# Patient Record
Sex: Female | Born: 1975 | Race: Black or African American | Hispanic: No
Health system: Southern US, Community
[De-identification: ages and names within clinical notes are randomized; demographics above are authoritative.]

---

## 2009-07-03 ENCOUNTER — Ambulatory Visit: Payer: Self-pay | Admitting: Internal Medicine

## 2009-07-03 ENCOUNTER — Inpatient Hospital Stay (HOSPITAL_COMMUNITY): Admission: EM | Admit: 2009-07-03 | Discharge: 2009-07-10 | Payer: Self-pay | Admitting: Internal Medicine

## 2010-05-07 LAB — HEMOGLOBIN A1C
Hgb A1c MFr Bld: 5.6 % (ref <5.7)
Mean Plasma Glucose: 114 mg/dL (ref <117)

## 2010-05-07 LAB — RENAL FUNCTION PANEL
Albumin: 2.4 g/dL — ABNORMAL LOW (ref 3.5–5.2)
Albumin: 2.5 g/dL — ABNORMAL LOW (ref 3.5–5.2)
Albumin: 2.5 g/dL — ABNORMAL LOW (ref 3.5–5.2)
Albumin: 2.6 g/dL — ABNORMAL LOW (ref 3.5–5.2)
Albumin: 2.7 g/dL — ABNORMAL LOW (ref 3.5–5.2)
BUN: 62 mg/dL — ABNORMAL HIGH (ref 6–23)
CO2: 22 mEq/L (ref 19–32)
CO2: 24 mEq/L (ref 19–32)
CO2: 26 mEq/L (ref 19–32)
CO2: 32 mEq/L (ref 19–32)
Calcium: 8.4 mg/dL (ref 8.4–10.5)
Calcium: 8.6 mg/dL (ref 8.4–10.5)
Calcium: 8.6 mg/dL (ref 8.4–10.5)
Chloride: 102 mEq/L (ref 96–112)
Chloride: 102 mEq/L (ref 96–112)
Chloride: 104 mEq/L (ref 96–112)
Chloride: 97 mEq/L (ref 96–112)
Creatinine, Ser: 4.45 mg/dL — ABNORMAL HIGH (ref 0.4–1.2)
Creatinine, Ser: 4.63 mg/dL — ABNORMAL HIGH (ref 0.4–1.2)
Creatinine, Ser: 4.75 mg/dL — ABNORMAL HIGH (ref 0.4–1.2)
GFR calc Af Amer: 13 mL/min — ABNORMAL LOW (ref >60)
GFR calc Af Amer: 13 mL/min — ABNORMAL LOW (ref >60)
GFR calc Af Amer: 14 mL/min — ABNORMAL LOW (ref >60)
GFR calc Af Amer: 14 mL/min — ABNORMAL LOW (ref >60)
GFR calc non Af Amer: 11 mL/min — ABNORMAL LOW (ref >60)
GFR calc non Af Amer: 11 mL/min — ABNORMAL LOW (ref >60)
GFR calc non Af Amer: 11 mL/min — ABNORMAL LOW (ref >60)
GFR calc non Af Amer: 11 mL/min — ABNORMAL LOW (ref >60)
Glucose, Bld: 137 mg/dL — ABNORMAL HIGH (ref 70–99)
Glucose, Bld: 154 mg/dL — ABNORMAL HIGH (ref 70–99)
Glucose, Bld: 73 mg/dL (ref 70–99)
Phosphorus: 4.3 mg/dL (ref 2.3–4.6)
Phosphorus: 5.6 mg/dL — ABNORMAL HIGH (ref 2.3–4.6)
Phosphorus: 6 mg/dL — ABNORMAL HIGH (ref 2.3–4.6)
Potassium: 3.3 mEq/L — ABNORMAL LOW (ref 3.5–5.1)
Potassium: 3.5 mEq/L (ref 3.5–5.1)
Potassium: 3.5 mEq/L (ref 3.5–5.1)
Potassium: 4 mEq/L (ref 3.5–5.1)
Potassium: 4.3 mEq/L (ref 3.5–5.1)
Sodium: 135 mEq/L (ref 135–145)
Sodium: 136 mEq/L (ref 135–145)
Sodium: 136 meq/L (ref 135–145)
Sodium: 137 mEq/L (ref 135–145)

## 2010-05-07 LAB — COMPREHENSIVE METABOLIC PANEL
ALT: 11 U/L (ref 0–35)
AST: 21 U/L (ref 0–37)
BUN: 45 mg/dL — ABNORMAL HIGH (ref 6–23)
CO2: 20 mEq/L (ref 19–32)
Chloride: 105 mEq/L (ref 96–112)
Chloride: 106 mEq/L (ref 96–112)
GFR calc Af Amer: 16 mL/min — ABNORMAL LOW (ref >60)
GFR calc Af Amer: 17 mL/min — ABNORMAL LOW (ref >60)
GFR calc non Af Amer: 13 mL/min — ABNORMAL LOW (ref >60)
Potassium: 3.5 mEq/L (ref 3.5–5.1)
Potassium: 3.6 mEq/L (ref 3.5–5.1)
Sodium: 137 mEq/L (ref 135–145)
Total Bilirubin: 0.7 mg/dL (ref 0.3–1.2)

## 2010-05-07 LAB — CBC
HCT: 27.9 % — ABNORMAL LOW (ref 36.0–46.0)
HCT: 31.7 % — ABNORMAL LOW (ref 36.0–46.0)
HCT: 33.2 % — ABNORMAL LOW (ref 36.0–46.0)
Hemoglobin: 10.8 g/dL — ABNORMAL LOW (ref 12.0–15.0)
Hemoglobin: 9 g/dL — ABNORMAL LOW (ref 12.0–15.0)
Hemoglobin: 9.1 g/dL — ABNORMAL LOW (ref 12.0–15.0)
MCHC: 32.4 g/dL (ref 30.0–36.0)
MCHC: 33 g/dL (ref 30.0–36.0)
MCV: 71.1 fL — ABNORMAL LOW (ref 78.0–100.0)
Platelets: 267 10*3/uL (ref 150–400)
Platelets: 277 10*3/uL (ref 150–400)
Platelets: 279 10*3/uL (ref 150–400)
Platelets: 307 10*3/uL (ref 150–400)
RBC: 3.89 MIL/uL (ref 3.87–5.11)
RBC: 3.93 MIL/uL (ref 3.87–5.11)
RBC: 3.95 MIL/uL (ref 3.87–5.11)
RBC: 4.02 MIL/uL (ref 3.87–5.11)
RBC: 4.16 MIL/uL (ref 3.87–5.11)
RBC: 4.46 MIL/uL (ref 3.87–5.11)
RBC: 4.59 MIL/uL (ref 3.87–5.11)
RBC: 4.72 MIL/uL (ref 3.87–5.11)
RDW: 18.5 % — ABNORMAL HIGH (ref 11.5–15.5)
RDW: 19 % — ABNORMAL HIGH (ref 11.5–15.5)
WBC: 10.8 10*3/uL — ABNORMAL HIGH (ref 4.0–10.5)
WBC: 11.1 10*3/uL — ABNORMAL HIGH (ref 4.0–10.5)
WBC: 12.3 10*3/uL — ABNORMAL HIGH (ref 4.0–10.5)
WBC: 14.6 10*3/uL — ABNORMAL HIGH (ref 4.0–10.5)
WBC: 26.2 10*3/uL — ABNORMAL HIGH (ref 4.0–10.5)

## 2010-05-07 LAB — GLUCOSE, CAPILLARY
Glucose-Capillary: 106 mg/dL — ABNORMAL HIGH (ref 70–99)
Glucose-Capillary: 117 mg/dL — ABNORMAL HIGH (ref 70–99)
Glucose-Capillary: 118 mg/dL — ABNORMAL HIGH (ref 70–99)
Glucose-Capillary: 129 mg/dL — ABNORMAL HIGH (ref 70–99)
Glucose-Capillary: 135 mg/dL — ABNORMAL HIGH (ref 70–99)
Glucose-Capillary: 147 mg/dL — ABNORMAL HIGH (ref 70–99)
Glucose-Capillary: 167 mg/dL — ABNORMAL HIGH (ref 70–99)
Glucose-Capillary: 167 mg/dL — ABNORMAL HIGH (ref 70–99)
Glucose-Capillary: 171 mg/dL — ABNORMAL HIGH (ref 70–99)
Glucose-Capillary: 244 mg/dL — ABNORMAL HIGH (ref 70–99)

## 2010-05-07 LAB — URINALYSIS, MICROSCOPIC ONLY
Bilirubin Urine: NEGATIVE
Ketones, ur: NEGATIVE mg/dL
Protein, ur: 100 mg/dL — AB
Protein, ur: NEGATIVE mg/dL
Specific Gravity, Urine: 1.01 (ref 1.005–1.030)
Urobilinogen, UA: 0.2 mg/dL (ref 0.0–1.0)
pH: 5.5 (ref 5.0–8.0)

## 2010-05-07 LAB — CARDIAC PANEL(CRET KIN+CKTOT+MB+TROPI)
CK, MB: 4.6 ng/mL — ABNORMAL HIGH (ref 0.3–4.0)
Total CK: 100 U/L (ref 7–177)
Total CK: 136 U/L (ref 7–177)
Total CK: 89 U/L (ref 7–177)
Troponin I: 0.24 ng/mL — ABNORMAL HIGH (ref 0.00–0.06)

## 2010-05-07 LAB — URINE CULTURE: Colony Count: 40000

## 2010-05-07 LAB — RETICULOCYTES
RBC.: 4.79 MIL/uL (ref 3.87–5.11)
Retic Count, Absolute: 143.7 10*3/uL (ref 19.0–186.0)

## 2010-05-07 LAB — PTH-RELATED PEPTIDE

## 2010-05-07 LAB — IRON AND TIBC
Saturation Ratios: 18 % — ABNORMAL LOW (ref 20–55)
TIBC: 226 ug/dL — ABNORMAL LOW (ref 250–470)
UIBC: 185 ug/dL

## 2010-05-07 LAB — UREA NITROGEN, URINE: Urea Nitrogen, Ur: 234 mg/dL

## 2010-05-07 LAB — MRSA PCR SCREENING: MRSA by PCR: NEGATIVE

## 2010-05-07 LAB — PROTIME-INR
INR: 1.4 (ref 0.00–1.49)
Prothrombin Time: 17 seconds — ABNORMAL HIGH (ref 11.6–15.2)

## 2010-05-07 LAB — SODIUM, URINE, RANDOM: Sodium, Ur: 89 mEq/L

## 2010-05-07 LAB — APTT: aPTT: 28 seconds (ref 24–37)

## 2010-05-07 LAB — PTH, INTACT AND CALCIUM
Calcium, Total (PTH): 8.3 mg/dL — ABNORMAL LOW (ref 8.4–10.5)
PTH: 219.7 pg/mL — ABNORMAL HIGH (ref 14.0–72.0)

## 2010-12-21 ENCOUNTER — Encounter (HOSPITAL_COMMUNITY)
Admission: RE | Admit: 2010-12-21 | Discharge: 2010-12-21 | Disposition: A | Discharge status: Home / Self Care | Payer: 59 | Source: Ambulatory Visit | Attending: Nephrology | Admitting: Nephrology

## 2010-12-21 DIAGNOSIS — D509 Iron deficiency anemia, unspecified: Secondary | ICD-10-CM | POA: Insufficient documentation

## 2011-01-02 ENCOUNTER — Other Ambulatory Visit (HOSPITAL_COMMUNITY): Payer: Self-pay | Admitting: *Deleted

## 2011-01-04 ENCOUNTER — Encounter (HOSPITAL_COMMUNITY)
Admission: RE | Admit: 2011-01-04 | Discharge: 2011-01-04 | Disposition: A | Discharge status: Home / Self Care | Payer: 59 | Source: Ambulatory Visit | Attending: Nephrology | Admitting: Nephrology

## 2011-01-04 MED ORDER — FERUMOXYTOL INJECTION 510 MG/17 ML
510.0000 mg | Freq: Once | INTRAVENOUS | Status: AC
  Administered 2011-01-04: 510 mg via INTRAVENOUS

## 2011-01-04 MED ORDER — FERUMOXYTOL INJECTION 510 MG/17 ML
INTRAVENOUS | Status: AC
  Administered 2011-01-04: 510 mg via INTRAVENOUS
  Filled 2011-01-04: qty 17

## 2011-06-22 IMAGING — US US RENAL
1 series · 14 of 25 positions shown · non-contrast
Comparison: None.

CLINICAL DATA: History of systemic lupus erythematosus.  Assess
kidneys.

RENAL/URINARY TRACT ULTRASOUND COMPLETE

[Series 1: us renal · 0.26mm/px · 14 of 38 slices shown]
[im 1/38]
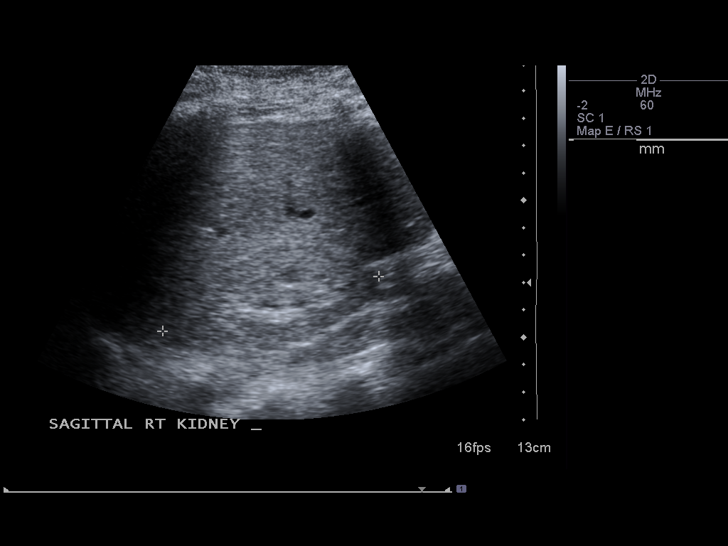
[im 4/38]
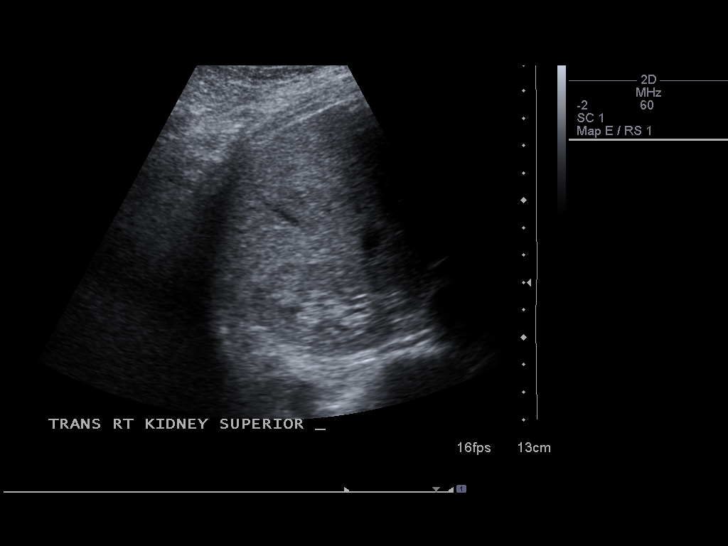
[im 7/38]
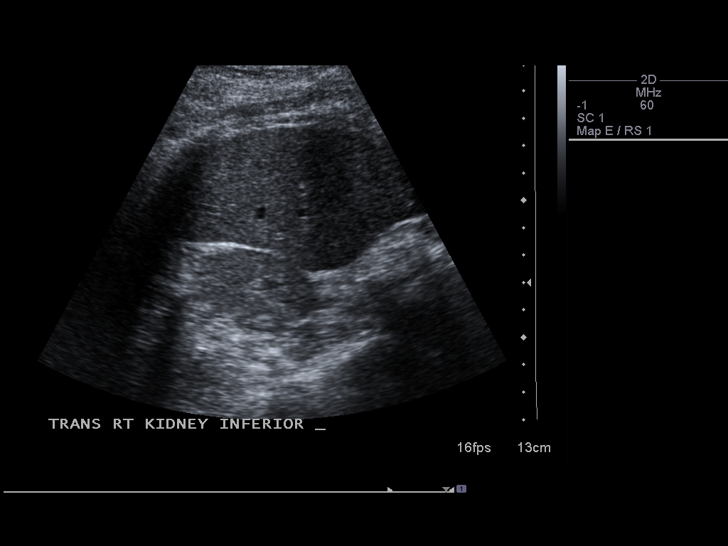
[im 10/38]
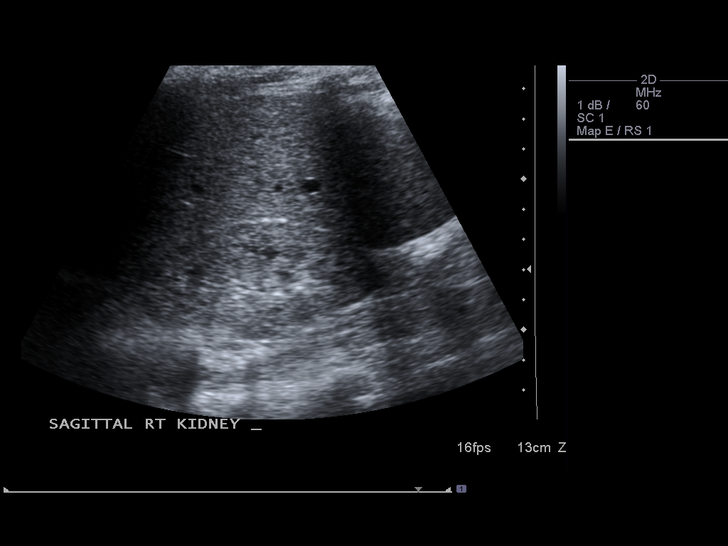
[im 13/38]
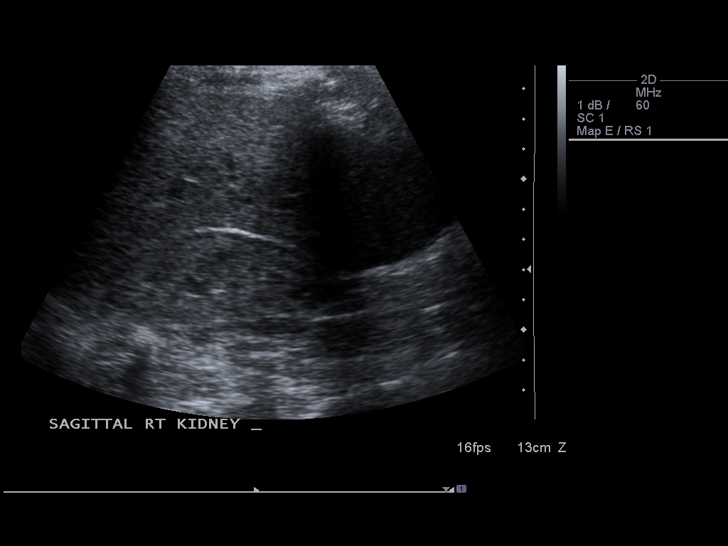
[im 14/38]
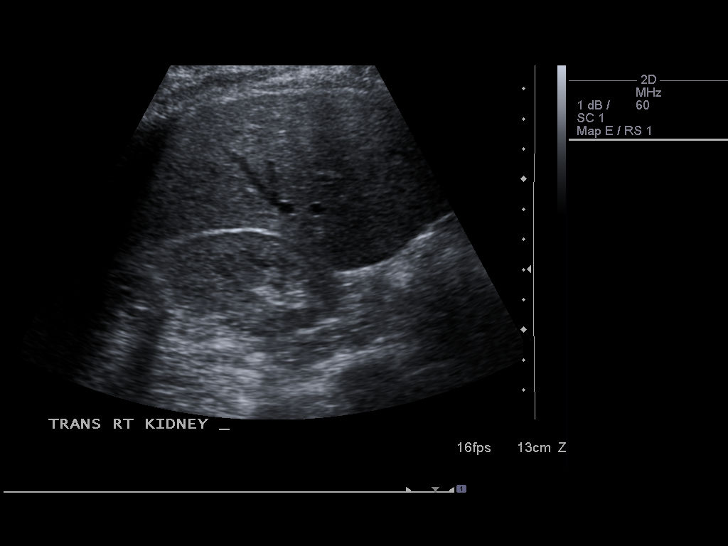
[im 17/38]
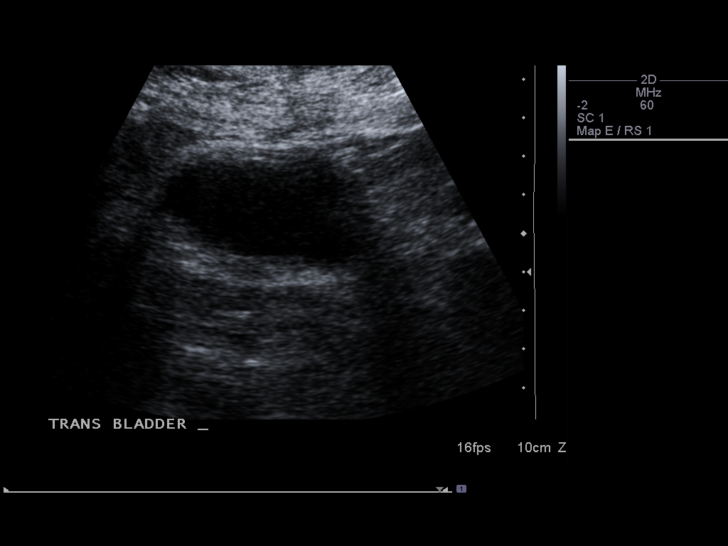
[im 21/38]
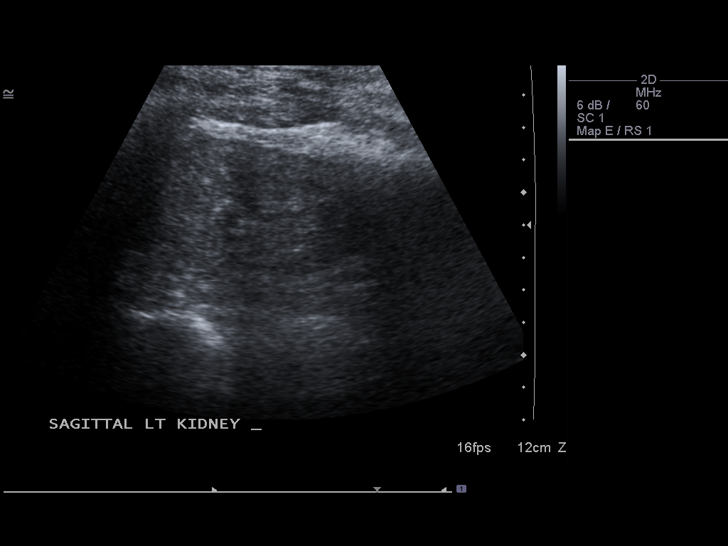
[im 24/38]
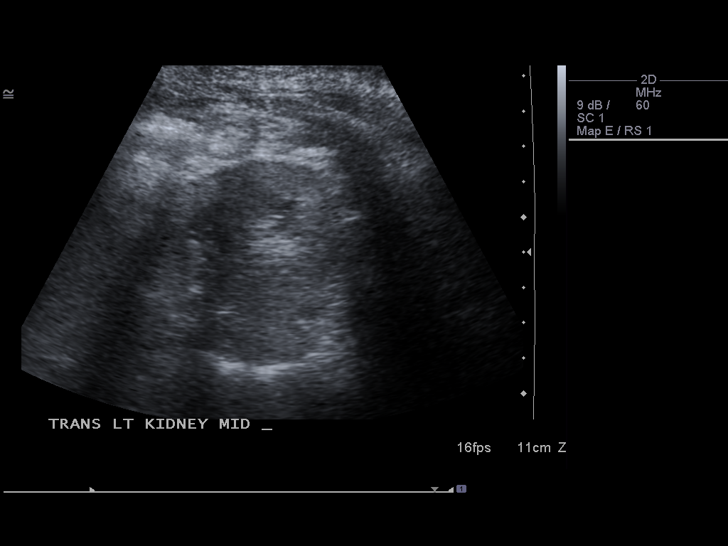
[im 25/38]
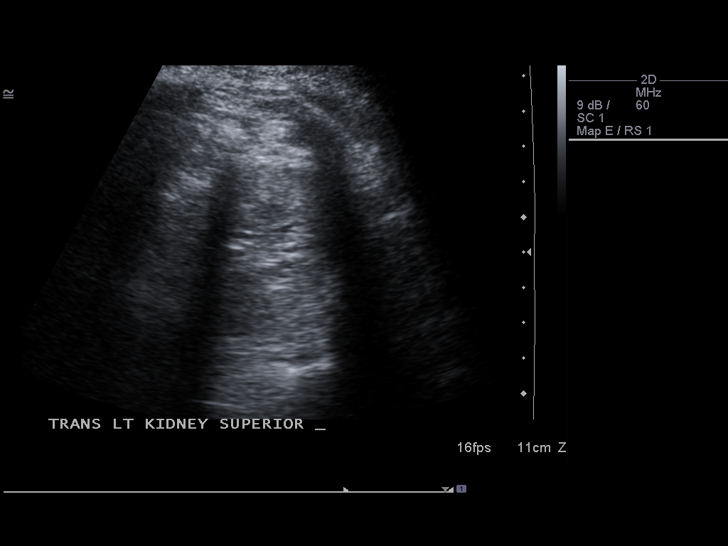
[im 28/38]
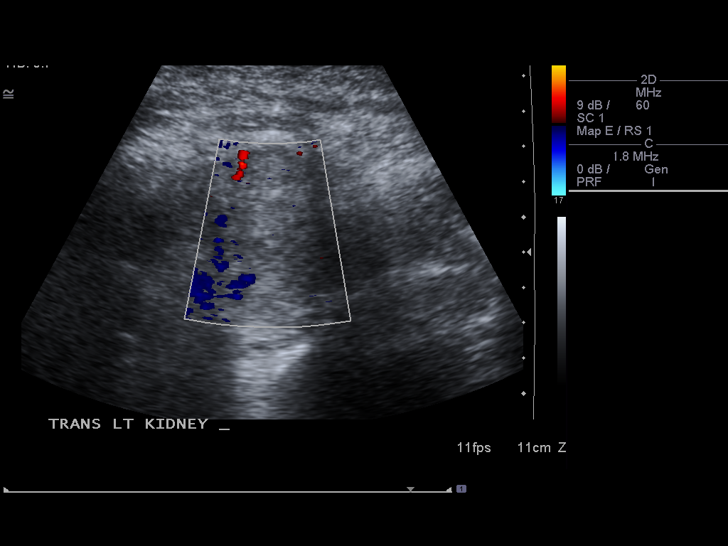
[im 31/38]
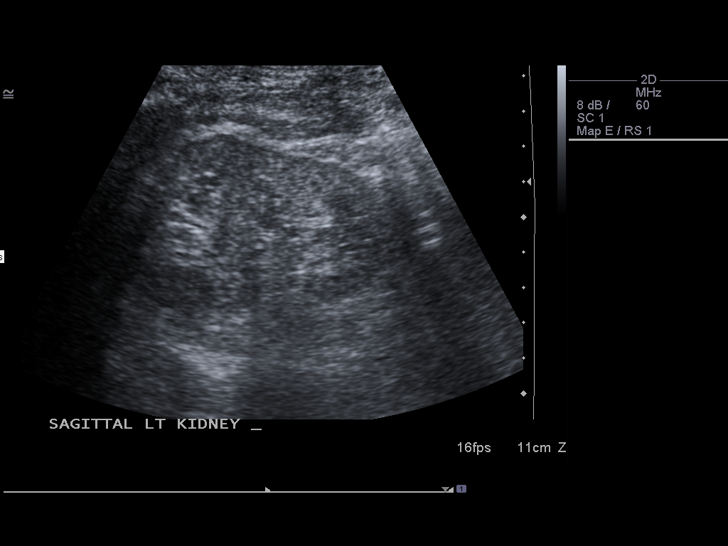
[im 34/38]
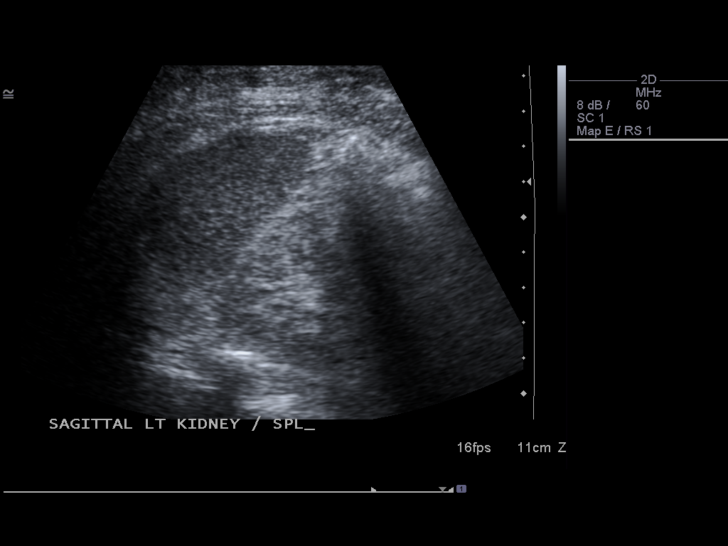
[im 38/38]
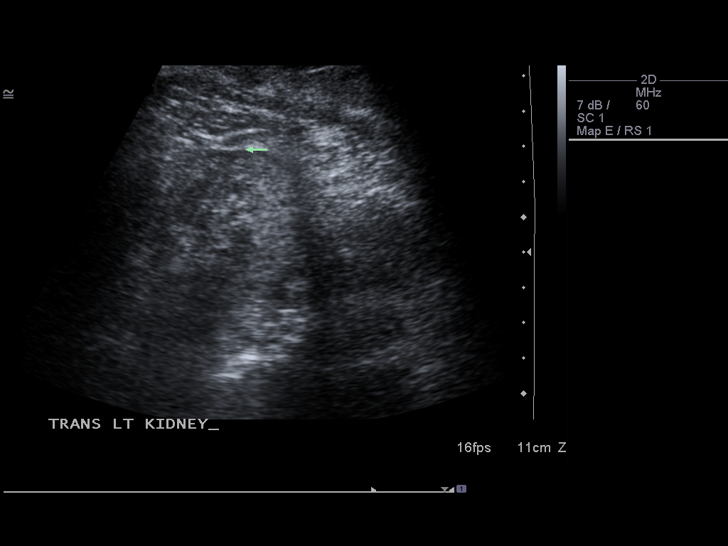

[14 of 25 positions shown; findings below may reference images not displayed]

FINDINGS: Right Kidney:  Right renal length is 8.1 cm.

Left Kidney:  Left renal length is 8.0 cm.

The parenchyma both kidneys is more echogenic than normal.  No
hydronephrosis, solid or cystic mass, calculus, or parenchymal loss
is evident.

Bladder:  The patient had voided prior to examination.  No bladder
lesion was evident.
IMPRESSION: Parenchyma of both kidneys is diffusely more echogenic than normal
consistent with medical renal disease.  No hydronephrosis,
calculus, or mass was evident.

## 2011-11-28 ENCOUNTER — Other Ambulatory Visit (HOSPITAL_COMMUNITY): Payer: Self-pay | Admitting: *Deleted

## 2011-11-29 ENCOUNTER — Encounter (HOSPITAL_COMMUNITY)
Admission: RE | Admit: 2011-11-29 | Discharge: 2011-11-29 | Disposition: A | Discharge status: Home / Self Care | Payer: 59 | Source: Ambulatory Visit | Attending: Nephrology | Admitting: Nephrology

## 2011-11-29 DIAGNOSIS — D509 Iron deficiency anemia, unspecified: Secondary | ICD-10-CM | POA: Insufficient documentation

## 2011-11-29 MED ORDER — SODIUM CHLORIDE 0.9 % IV SOLN
INTRAVENOUS | Status: DC
  Administered 2011-11-29: 11:00:00 via INTRAVENOUS

## 2011-11-29 MED ORDER — FERUMOXYTOL INJECTION 510 MG/17 ML
INTRAVENOUS | Status: AC
  Filled 2011-11-29: qty 17

## 2011-11-29 MED ORDER — FERUMOXYTOL INJECTION 510 MG/17 ML
510.0000 mg | INTRAVENOUS | Status: DC
  Administered 2011-11-29: 510 mg via INTRAVENOUS

## 2011-12-06 ENCOUNTER — Encounter (HOSPITAL_COMMUNITY)
Admission: RE | Admit: 2011-12-06 | Discharge: 2011-12-06 | Disposition: A | Discharge status: Home / Self Care | Payer: 59 | Source: Ambulatory Visit | Attending: Nephrology | Admitting: Nephrology

## 2011-12-06 MED ORDER — FERUMOXYTOL INJECTION 510 MG/17 ML
510.0000 mg | INTRAVENOUS | Status: AC
  Administered 2011-12-06: 510 mg via INTRAVENOUS

## 2011-12-06 MED ORDER — SODIUM CHLORIDE 0.9 % IV SOLN: INTRAVENOUS | Status: DC

## 2011-12-06 MED ORDER — FERUMOXYTOL INJECTION 510 MG/17 ML
INTRAVENOUS | Status: AC
  Filled 2011-12-06: qty 17

## 2013-01-05 ENCOUNTER — Other Ambulatory Visit: Payer: Self-pay | Admitting: Family Medicine

## 2013-01-05 ENCOUNTER — Other Ambulatory Visit (HOSPITAL_COMMUNITY)
Admission: RE | Admit: 2013-01-05 | Discharge: 2013-01-05 | Disposition: A | Discharge status: Home / Self Care | Payer: BC Managed Care – PPO | Source: Ambulatory Visit | Attending: Family Medicine | Admitting: Family Medicine

## 2013-01-05 DIAGNOSIS — Z1151 Encounter for screening for human papillomavirus (HPV): Secondary | ICD-10-CM | POA: Insufficient documentation

## 2013-01-05 DIAGNOSIS — Z124 Encounter for screening for malignant neoplasm of cervix: Secondary | ICD-10-CM | POA: Insufficient documentation

## 2013-07-02 ENCOUNTER — Other Ambulatory Visit (HOSPITAL_COMMUNITY): Payer: Self-pay | Admitting: *Deleted

## 2013-07-05 ENCOUNTER — Encounter (HOSPITAL_COMMUNITY)
Admission: RE | Admit: 2013-07-05 | Discharge: 2013-07-05 | Disposition: A | Discharge status: Home / Self Care | Payer: BC Managed Care – PPO | Source: Ambulatory Visit | Attending: Nephrology | Admitting: Nephrology

## 2013-07-05 DIAGNOSIS — D509 Iron deficiency anemia, unspecified: Secondary | ICD-10-CM | POA: Insufficient documentation

## 2013-07-05 MED ORDER — SODIUM CHLORIDE 0.9 % IV SOLN
1020.0000 mg | Freq: Once | INTRAVENOUS | Status: AC
  Administered 2013-07-05: 1020 mg via INTRAVENOUS
  Filled 2013-07-05: qty 34

## 2014-10-12 ENCOUNTER — Encounter (HOSPITAL_COMMUNITY)
Admission: RE | Admit: 2014-10-12 | Discharge: 2014-10-12 | Disposition: A | Discharge status: Home / Self Care | Payer: Medicaid Other | Source: Ambulatory Visit | Attending: Nephrology | Admitting: Nephrology

## 2014-10-12 DIAGNOSIS — D509 Iron deficiency anemia, unspecified: Secondary | ICD-10-CM | POA: Diagnosis present

## 2014-10-12 MED ORDER — SODIUM CHLORIDE 0.9 % IV SOLN
Freq: Once | INTRAVENOUS | Status: AC
  Administered 2014-10-12: 200 mL via INTRAVENOUS

## 2014-10-12 MED ORDER — FERUMOXYTOL INJECTION 510 MG/17 ML
510.0000 mg | Freq: Once | INTRAVENOUS | Status: AC
  Administered 2014-10-12: 510 mg via INTRAVENOUS
  Filled 2014-10-12: qty 17

## 2014-10-12 NOTE — Progress Notes (Signed)
Tolerated feraheme infusion well. To return in a week for next infusion.

## 2014-10-19 ENCOUNTER — Encounter (HOSPITAL_COMMUNITY)
Admission: RE | Admit: 2014-10-19 | Discharge: 2014-10-19 | Disposition: A | Discharge status: Home / Self Care | Payer: Medicaid Other | Source: Ambulatory Visit | Attending: Nephrology | Admitting: Nephrology

## 2014-10-19 ENCOUNTER — Encounter (HOSPITAL_COMMUNITY): Payer: Medicaid Other

## 2014-10-19 DIAGNOSIS — D509 Iron deficiency anemia, unspecified: Secondary | ICD-10-CM | POA: Diagnosis not present

## 2014-10-19 MED ORDER — SODIUM CHLORIDE 0.9 % IV SOLN
510.0000 mg | Freq: Once | INTRAVENOUS | Status: AC
  Administered 2014-10-19: 510 mg via INTRAVENOUS
  Filled 2014-10-19: qty 17

## 2024-12-14 ENCOUNTER — Ambulatory Visit: Payer: Self-pay | Admitting: Physician Assistant
